# Patient Record
Sex: Female | Born: 1975 | Race: White | Hispanic: No | Marital: Single | Smoking: Former smoker
Health system: Southern US, Community
[De-identification: ages and names within clinical notes are randomized; demographics above are authoritative.]

## PROBLEM LIST (undated history)

## (undated) DIAGNOSIS — F419 Anxiety disorder, unspecified: Secondary | ICD-10-CM

## (undated) HISTORY — PX: CHOLECYSTECTOMY: SHX55

---

## 2011-11-03 ENCOUNTER — Emergency Department (HOSPITAL_BASED_OUTPATIENT_CLINIC_OR_DEPARTMENT_OTHER)
Admission: EM | Admit: 2011-11-03 | Discharge: 2011-11-03 | Disposition: A | Discharge status: Home / Self Care | Payer: Medicaid Other | Attending: Emergency Medicine | Admitting: Emergency Medicine

## 2011-11-03 ENCOUNTER — Encounter (HOSPITAL_BASED_OUTPATIENT_CLINIC_OR_DEPARTMENT_OTHER): Payer: Self-pay | Admitting: *Deleted

## 2011-11-03 DIAGNOSIS — B9689 Other specified bacterial agents as the cause of diseases classified elsewhere: Secondary | ICD-10-CM

## 2011-11-03 DIAGNOSIS — N760 Acute vaginitis: Secondary | ICD-10-CM

## 2011-11-03 DIAGNOSIS — A499 Bacterial infection, unspecified: Secondary | ICD-10-CM | POA: Insufficient documentation

## 2011-11-03 DIAGNOSIS — F17200 Nicotine dependence, unspecified, uncomplicated: Secondary | ICD-10-CM | POA: Insufficient documentation

## 2011-11-03 HISTORY — DX: Anxiety disorder, unspecified: F41.9

## 2011-11-03 LAB — URINALYSIS, ROUTINE W REFLEX MICROSCOPIC
Glucose, UA: NEGATIVE mg/dL
Ketones, ur: 15 mg/dL — AB
Nitrite: NEGATIVE
Protein, ur: NEGATIVE mg/dL

## 2011-11-03 LAB — PREGNANCY, URINE: Preg Test, Ur: NEGATIVE

## 2011-11-03 LAB — URINE MICROSCOPIC-ADD ON

## 2011-11-03 LAB — WET PREP, GENITAL

## 2011-11-03 MED ORDER — METRONIDAZOLE 500 MG PO TABS: 500.0000 mg | ORAL_TABLET | Freq: Two times a day (BID) | ORAL | Status: AC

## 2011-11-03 NOTE — Discharge Instructions (Signed)
Bacterial Vaginosis Bacterial vaginosis (BV) is a vaginal infection where the normal balance of bacteria in the vagina is disrupted. The normal balance is then replaced by an overgrowth of certain bacteria. There are several different kinds of bacteria that can cause BV. BV is the most common vaginal infection in women of childbearing age. CAUSES   The cause of BV is not fully understood. BV develops when there is an increase or imbalance of harmful bacteria.   Some activities or behaviors can upset the normal balance of bacteria in the vagina and put women at increased risk including:   Having a new sex partner or multiple sex partners.   Douching.   Using an intrauterine device (IUD) for contraception.   It is not clear what role sexual activity plays in the development of BV. However, women that have never had sexual intercourse are rarely infected with BV.  Women do not get BV from toilet seats, bedding, swimming pools or from touching objects around them.  SYMPTOMS   Grey vaginal discharge.   A fish-like odor with discharge, especially after sexual intercourse.   Itching or burning of the vagina and vulva.   Burning or pain with urination.   Some women have no signs or symptoms at all.  DIAGNOSIS  Your caregiver must examine the vagina for signs of BV. Your caregiver will perform lab tests and look at the sample of vaginal fluid through a microscope. They will look for bacteria and abnormal cells (clue cells), a pH test higher than 4.5, and a positive amine test all associated with BV.  RISKS AND COMPLICATIONS   Pelvic inflammatory disease (PID).   Infections following gynecology surgery.   Developing HIV.   Developing herpes virus.  TREATMENT  Sometimes BV will clear up without treatment. However, all women with symptoms of BV should be treated to avoid complications, especially if gynecology surgery is planned. Female partners generally do not need to be treated. However,  BV may spread between female sex partners so treatment is helpful in preventing a recurrence of BV.   BV may be treated with antibiotics. The antibiotics come in either pill or vaginal cream forms. Either can be used with nonpregnant or pregnant women, but the recommended dosages differ. These antibiotics are not harmful to the baby.   BV can recur after treatment. If this happens, a second round of antibiotics will often be prescribed.   Treatment is important for pregnant women. If not treated, BV can cause a premature delivery, especially for a pregnant woman who had a premature birth in the past. All pregnant women who have symptoms of BV should be checked and treated.   For chronic reoccurrence of BV, treatment with a type of prescribed gel vaginally twice a week is helpful.  HOME CARE INSTRUCTIONS   Finish all medication as directed by your caregiver.   Do not have sex until treatment is completed.   Tell your sexual partner that you have a vaginal infection. They should see their caregiver and be treated if they have problems, such as a mild rash or itching.   Practice safe sex. Use condoms. Only have 1 sex partner.  PREVENTION  Basic prevention steps can help reduce the risk of upsetting the natural balance of bacteria in the vagina and developing BV:  Do not have sexual intercourse (be abstinent).   Do not douche.   Use all of the medicine prescribed for treatment of BV, even if the signs and symptoms go away.     Tell your sex partner if you have BV. That way, they can be treated, if needed, to prevent reoccurrence.  SEEK MEDICAL CARE IF:   Your symptoms are not improving after 3 days of treatment.   You have increased discharge, pain, or fever.  MAKE SURE YOU:   Understand these instructions.   Will watch your condition.   Will get help right away if you are not doing well or get worse.  FOR MORE INFORMATION  Division of STD Prevention (DSTDP), Centers for Disease  Control and Prevention: www.cdc.gov/std American Social Health Association (ASHA): www.ashastd.org  Document Released: 09/01/2005 Document Revised: 05/14/2011 Document Reviewed: 02/22/2009 [employer] Patient Information 2012 [employer]. 

## 2011-11-03 NOTE — ED Provider Notes (Signed)
Medical screening examination/treatment/procedure(s) were performed by non-physician practitioner and as supervising physician I was immediately available for consultation/collaboration.   Lora Glomski A. Apolo Cutshaw, MD 11/03/11 2347 

## 2011-11-03 NOTE — ED Notes (Signed)
Irregular menses. Implanted birth control. Here for abdominal pain, vomiting and diarrhea.

## 2011-11-03 NOTE — ED Provider Notes (Signed)
History     CSN: [id number]  Arrival date & time 11/03/11  2105   First MD Initiated Contact with Patient 11/03/11 2131      Chief Complaint  Patient presents with  . Abdominal Pain    (Consider location/radiation/quality/duration/timing/severity/associated sxs/prior treatment) HPI Comments: Pt states that her period has been abnormal for the last couple of months so she wanted to see if she had a miscarriage:pt states that the may have also been exposed to an std and she want to be checked for that   Patient is a 36 y.o. female presenting with abdominal pain. The history is provided by the patient. No language interpreter was used.  Abdominal Pain The primary symptoms of the illness include abdominal pain and vaginal discharge. The primary symptoms of the illness do not include nausea, vomiting, diarrhea or dysuria. The current episode started more than 2 days ago. The onset of the illness was gradual. The problem has not changed since onset. The vaginal discharge is not associated with dysuria.   The patient states that she believes she is currently not pregnant. The patient has not had a change in bowel habit.    Past Medical History  Diagnosis Date  . Anxiety     Past Surgical History  Procedure Date  . Cesarean section     No family history on file.  History  Substance Use Topics  . Smoking status: Current Everyday Smoker -- 0.5 packs/day  . Smokeless tobacco: Not on file  . Alcohol Use: Yes    OB History    Grav Para Term Preterm Abortions TAB SAB Ect Mult Living                  Review of Systems  Gastrointestinal: Positive for abdominal pain. Negative for nausea, vomiting and diarrhea.  Genitourinary: Positive for vaginal discharge. Negative for dysuria.  All other systems reviewed and are negative.    Allergies  Review of patient's allergies indicates no known allergies.  Home Medications  No current outpatient prescriptions on file.  BP 117/76   Pulse 80  Temp(Src) 98.3 F (36.8 C) (Oral)  Resp 22  SpO2 100%  LMP 08/16/2011  Physical Exam  Nursing note and vitals reviewed. Constitutional: She is oriented to person, place, and time. She appears well-developed and well-nourished.  HENT:  Head: Normocephalic and atraumatic.  Eyes: EOM are normal.  Neck: Neck supple.  Cardiovascular: Normal rate and regular rhythm.   Pulmonary/Chest: Effort normal and breath sounds normal.  Abdominal: Soft. Bowel sounds are normal. There is no tenderness.  Genitourinary:       White vaginal discharge:neg cmt  Musculoskeletal: Normal range of motion.  Neurological: She is alert and oriented to person, place, and time.  Skin: Skin is warm and dry.  Psychiatric: She has a normal mood and affect.    ED Course  Procedures (including critical care time)  Labs Reviewed  URINALYSIS, ROUTINE W REFLEX MICROSCOPIC - Abnormal; Notable for the following:    APPearance CLOUDY (*)    Ketones, ur 15 (*)    Leukocytes, UA TRACE (*)    All other components within normal limits  URINE MICROSCOPIC-ADD ON - Abnormal; Notable for the following:    Squamous Epithelial / LPF FEW (*)    Bacteria, UA MANY (*)    All other components within normal limits  PREGNANCY, URINE  GC/CHLAMYDIA PROBE AMP, GENITAL  WET PREP, GENITAL   No results found.   1. BV (bacterial vaginosis)  MDM  Possible early ZOX:WRUEA sent for culture:will treat for VW:UJWJ not consistent with pid        Teressa Lower, NP 11/03/11 2254

## 2011-11-04 NOTE — ED Notes (Signed)
Pt called requesting new rx instead of flagyl. RX for metrogel vaginal inserts, disp #5, no refills called to [facility] on [address] in [place] per pt request. Lonna Cobb gave verbal order for med change.

## 2011-11-05 LAB — GC/CHLAMYDIA PROBE AMP, GENITAL
Chlamydia, DNA Probe: NEGATIVE
GC Probe Amp, Genital: NEGATIVE

## 2011-11-05 LAB — URINE CULTURE
Colony Count: >=100000
Culture  Setup Time: 201302190713

## 2011-11-06 NOTE — ED Notes (Signed)
+   urine Chart sent to EDP office for review. 

## 2011-11-07 NOTE — ED Notes (Signed)
Rx for  Bactrim Ds 1 po po BID x 3 days needs to be called to pharmacy written by Felicie Morn. Attempt made to notify patient ,no answer.

## 2011-11-08 NOTE — ED Notes (Signed)
Attempted to call patient.  No answer.

## 2011-11-09 NOTE — ED Notes (Signed)
Attempted to call patient. No answer. Will send letter.  

## 2011-11-10 NOTE — ED Notes (Signed)
Rx called to  [facility]. [phone]

## 2012-02-08 ENCOUNTER — Encounter (HOSPITAL_BASED_OUTPATIENT_CLINIC_OR_DEPARTMENT_OTHER): Payer: Self-pay | Admitting: *Deleted

## 2012-02-08 ENCOUNTER — Emergency Department (HOSPITAL_BASED_OUTPATIENT_CLINIC_OR_DEPARTMENT_OTHER)
Admission: EM | Admit: 2012-02-08 | Discharge: 2012-02-09 | Disposition: A | Discharge status: Home / Self Care | Payer: Medicaid Other | Attending: Emergency Medicine | Admitting: Emergency Medicine

## 2012-02-08 ENCOUNTER — Emergency Department (HOSPITAL_BASED_OUTPATIENT_CLINIC_OR_DEPARTMENT_OTHER): Payer: Medicaid Other

## 2012-02-08 DIAGNOSIS — M79609 Pain in unspecified limb: Secondary | ICD-10-CM | POA: Insufficient documentation

## 2012-02-08 DIAGNOSIS — W208XXA Other cause of strike by thrown, projected or falling object, initial encounter: Secondary | ICD-10-CM | POA: Insufficient documentation

## 2012-02-08 DIAGNOSIS — L089 Local infection of the skin and subcutaneous tissue, unspecified: Secondary | ICD-10-CM | POA: Insufficient documentation

## 2012-02-08 DIAGNOSIS — S9030XA Contusion of unspecified foot, initial encounter: Secondary | ICD-10-CM | POA: Insufficient documentation

## 2012-02-08 DIAGNOSIS — F411 Generalized anxiety disorder: Secondary | ICD-10-CM | POA: Insufficient documentation

## 2012-02-08 MED ORDER — TRAMADOL HCL 50 MG PO TABS: 50.0000 mg | ORAL_TABLET | Freq: Four times a day (QID) | ORAL | Status: AC | PRN

## 2012-02-08 NOTE — Discharge Instructions (Signed)
Contusion (Bruise) of Foot Injury to the foot causes bruises (contusions). Contusions are caused by bleeding from small blood vessels that allow blood to leak out into the muscles, cord-like structures that attach muscle to bone (tendons), and/or other soft tissue.  CAUSES  Contusions of the foot are common. Bruises are frequently seen from:  Contact sports injuries.   The use of medications that thin the blood (anti-coagulants).   Aspirin and non-steroidal anti-inflammatory agents that decrease the clotting ability.   People with vitamin deficiencies.  SYMPTOMS  Signs of foot injury include pain and swelling. At first there may be discoloration from blood under the skin. This will appear blue to purple in color. As the bruise ages, the color turns yellow. Swelling may limit the movement of the toes.  Complications from foot injury may include:  Collections of blood leading to disability if calcium deposits form. These can later limit movement in the foot.   Infection of the foot if there are breaks in the skin.   Rupture of the tendons that may need surgical repair.  DIAGNOSIS  Diagnosing foot injuries can be made by observation. If problems continue, X-rays may be needed to make sure there are no broken bones (fractures). Continuing problems may require physical therapy.  HOME CARE INSTRUCTIONS   Apply ice to the injury for 15 to 20 minutes, 3 to 4 times per day. Put the ice in a plastic bag and place a towel between the bag of ice and your skin.   An elastic wrap (like an Ace bandage) may be used to keep swelling down.   Keep foot elevated to reduce swelling and discomfort.   Try to avoid standing or walking while the foot is painful. Do not resume use until instructed by your caregiver. Then begin use gradually. If pain develops, decrease use and continue the above measures. Gradually increase activities that do not cause discomfort until you slowly have normal use.   Only take  over-the-counter or prescription medicines for pain, discomfort, or fever as directed by your caregiver. Use only if your caregiver has not given medications that would interfere.   Begin daily rehabilitation exercises when supportive wrapping is no longer needed.   Use ice massage for 10 minutes before and after workouts. Fill a large styrofoam cup with water and freeze. Tear a small amount of foam from the top so ice protrudes. Massage ice firmly over the injured area in a circle about the size of a softball.   Always eat a well balanced diet.   Follow all instructions for follow up with your caregiver, any orthopedic referrals, physical therapy and rehabilitation. Any delay in obtaining necessary care could result in delayed healing, and temporary or permanent disability.  SEEK IMMEDIATE MEDICAL CARE IF:   Your pain and swelling increase, or pain is uncontrolled with medications.   You have loss of feeling in your foot, or your foot turns cold or blue.   An oral temperature above 102 F (38.9 C) develops, not controlled by medication.   Your foot becomes warm to touch, or you have more pain with movement of your toes.   Skin is broken and signs of infection occur (drainage, increasing pain, fever, headache, muscle aches, dizziness or a general ill feeling).   You develop new, unexplained symptoms, or an increase of the symptoms that brought you to your caregiver.  MAKE SURE YOU:   Understand these instructions.   Will watch your condition.   Will get help  right away if you are not doing well or get worse.  Document Released: 06/23/2006 Document Revised: 08/21/2011 Document Reviewed: 08/05/2011 [facility] Patient Information 2012 [place], [place].

## 2012-02-08 NOTE — ED Provider Notes (Signed)
Medical screening examination/treatment/procedure(s) were performed by non-physician practitioner and as supervising physician I was immediately available for consultation/collaboration.  Ethelda Chick, MD 02/08/12 [phone]

## 2012-02-08 NOTE — ED Provider Notes (Signed)
History     CSN: [id number]  Arrival date & time 02/08/12  2116   First MD Initiated Contact with Patient 02/08/12 2313     HPI Patient reports 2 weeks ago dropped a mayo jar which are on her left foot. Reports that she still has pain despite putting ice on foot. States tender to walk. Denies numbness, tingling, weakness, bruising, swelling. Reports no improvement with ibuprofen. Also reports a strange " pimple " on her left nipple. States it has only been there for a few days. Reports he tried to "pop it" but was unable to. Denies any pain, drainage, redness, swelling. Patient is a 36 y.o. female presenting with lower extremity pain. The history is provided by the patient.  Foot Pain This is a new problem. The current episode started 1 to 4 weeks ago. The problem occurs constantly. The problem has been unchanged. Pertinent negatives include no anorexia, chills, fever, nausea, numbness, vomiting or weakness. The symptoms are aggravated by walking and standing. She has tried ice and rest for the symptoms. The treatment provided no relief.    Past Medical History  Diagnosis Date  . Anxiety     Past Surgical History  Procedure Date  . Cesarean section     History reviewed. No pertinent family history.  History  Substance Use Topics  . Smoking status: Current Everyday Smoker -- 0.5 packs/day  . Smokeless tobacco: Not on file  . Alcohol Use: Yes    OB History    Grav Para Term Preterm Abortions TAB SAB Ect Mult Living                  Review of Systems  Constitutional: Negative for fever and chills.  Gastrointestinal: Negative for nausea, vomiting and anorexia.  Musculoskeletal:       Foot pain  Skin:       Pimple on nipple  Neurological: Negative for weakness and numbness.  All other systems reviewed and are negative.    Allergies  Review of patient's allergies indicates no known allergies.  Home Medications   Current Outpatient Rx  Name Route Sig Dispense Refill    . CLONAZEPAM 1 MG PO TABS Oral Take 1 mg by mouth 2 (two) times daily as needed.    . ETONOGESTREL 68 MG  IMPL Subcutaneous Inject 1 each into the skin once.    . IBUPROFEN 200 MG PO TABS Oral Take 800 mg by mouth every 6 (six) hours as needed. Patient used this medication for her foot pain.      BP 111/64  Pulse 76  Temp(Src) 98 F (36.7 C) (Oral)  Resp 20  Ht 5\' 4"  (1.626 m)  Wt 165 lb (74.844 kg)  BMI 28.32 kg/m2  SpO2 97%  LMP 02/06/2012  Physical Exam  Vitals reviewed. Constitutional: She is oriented to person, place, and time. She appears well-developed and well-nourished.  HENT:  Head: Normocephalic and atraumatic.  Eyes: Conjunctivae and EOM are normal. Pupils are equal, round, and reactive to light.  Neck: Normal range of motion. Neck supple. No spinous process tenderness and no muscular tenderness present. No edema, no erythema and normal range of motion present.  Abdominal: Soft. Bowel sounds are normal.          Musculoskeletal:       Left foot: She exhibits tenderness. She exhibits normal range of motion, no bony tenderness, no swelling, normal capillary refill, no crepitus and no deformity.       Feet:  Neurological:  She is alert and oriented to person, place, and time. She has normal strength. No sensory deficit. Gait normal.  Skin: Skin is warm and dry. Lesion (Small pustule on left nipple at the 2  o clock position) noted. No rash noted. No erythema. No pallor.    ED Course  Procedures  Dg Foot Complete Left  02/08/2012  *RADIOLOGY REPORT*  Clinical Data: Pain in the left foot after dropping a jar on the foot 2 weeks ago.  LEFT FOOT - COMPLETE 3+ VIEW  Comparison: None.  Findings: Mild dorsal soft tissue swelling.  Visualized bones appear intact. No evidence of acute fracture or subluxation.  No focal bone lesions.  Bone matrix and cortex appear intact.  No abnormal radiopaque densities in the soft tissues.  IMPRESSION: No acute bony abnormalities.  Original  Report Authenticated By: Marlon Pel, M.D.   MDM    .She placed the patient for comfort. Advised using warm compresses and warm water soaks her foot. Advised warm compresses on left breast. Discussed signs of infection and reasons to return for example if the pustule increases in size, becomes tender or erythematous. Patient voices understanding and is ready for discharge.      Thomasene Lot, PA-C 02/08/12 2349  Thomasene Lot, PA-C 02/08/12 2356

## 2012-02-08 NOTE — ED Notes (Signed)
Pt states she dropped a jar of mayo on her left foot 2 weeks ago. Also wants area on left nipple checked.

## 2012-10-15 ENCOUNTER — Emergency Department (HOSPITAL_BASED_OUTPATIENT_CLINIC_OR_DEPARTMENT_OTHER): Payer: Medicaid Other

## 2012-10-15 ENCOUNTER — Emergency Department (HOSPITAL_BASED_OUTPATIENT_CLINIC_OR_DEPARTMENT_OTHER)
Admission: EM | Admit: 2012-10-15 | Discharge: 2012-10-15 | Disposition: A | Discharge status: Home / Self Care | Payer: Medicaid Other | Attending: Emergency Medicine | Admitting: Emergency Medicine

## 2012-10-15 ENCOUNTER — Encounter (HOSPITAL_BASED_OUTPATIENT_CLINIC_OR_DEPARTMENT_OTHER): Payer: Self-pay | Admitting: Family Medicine

## 2012-10-15 DIAGNOSIS — W19XXXA Unspecified fall, initial encounter: Secondary | ICD-10-CM

## 2012-10-15 DIAGNOSIS — S0510XA Contusion of eyeball and orbital tissues, unspecified eye, initial encounter: Secondary | ICD-10-CM | POA: Insufficient documentation

## 2012-10-15 DIAGNOSIS — T148XXA Other injury of unspecified body region, initial encounter: Secondary | ICD-10-CM

## 2012-10-15 DIAGNOSIS — F411 Generalized anxiety disorder: Secondary | ICD-10-CM | POA: Insufficient documentation

## 2012-10-15 DIAGNOSIS — Y9289 Other specified places as the place of occurrence of the external cause: Secondary | ICD-10-CM | POA: Insufficient documentation

## 2012-10-15 DIAGNOSIS — S40019A Contusion of unspecified shoulder, initial encounter: Secondary | ICD-10-CM | POA: Insufficient documentation

## 2012-10-15 DIAGNOSIS — W11XXXA Fall on and from ladder, initial encounter: Secondary | ICD-10-CM | POA: Insufficient documentation

## 2012-10-15 DIAGNOSIS — Y9389 Activity, other specified: Secondary | ICD-10-CM | POA: Insufficient documentation

## 2012-10-15 DIAGNOSIS — F17200 Nicotine dependence, unspecified, uncomplicated: Secondary | ICD-10-CM | POA: Insufficient documentation

## 2012-10-15 DIAGNOSIS — M25519 Pain in unspecified shoulder: Secondary | ICD-10-CM

## 2012-10-15 MED ORDER — HYDROCODONE-ACETAMINOPHEN 5-325 MG PO TABS: 2.0000 | ORAL_TABLET | ORAL | Status: AC | PRN

## 2012-10-15 NOTE — ED Provider Notes (Signed)
History     CSN: [id number]  Arrival date & time 10/15/12  1158   First MD Initiated Contact with Patient 10/15/12 1203      Chief Complaint  Patient presents with  . Fall    (Consider location/radiation/quality/duration/timing/severity/associated sxs/prior treatment) HPI Comments: Pt states that she thinks she may have passed out but she is not sure:pt states that she is hurting all over but primarily in the right shoulder and around her right eye  Patient is a 37 y.o. female presenting with fall. The history is provided by the patient. No language interpreter was used.  Fall The accident occurred 6 to 12 hours ago. The fall occurred from a ladder. She fell from a height of 6 to 10 ft. She landed on carpet. There was no blood loss. The point of impact was the right shoulder. The pain is moderate. She was ambulatory at the scene. There was no entrapment after the fall. There was no drug use involved in the accident. There was no alcohol use involved in the accident.    Past Medical History  Diagnosis Date  . Anxiety     Past Surgical History  Procedure Date  . Cesarean section     No family history on file.  History  Substance Use Topics  . Smoking status: Current Every Day Smoker -- 0.5 packs/day  . Smokeless tobacco: Not on file  . Alcohol Use: Yes    OB History    Grav Para Term Preterm Abortions TAB SAB Ect Mult Living                  Review of Systems  Constitutional: Negative.   Respiratory: Negative.   Cardiovascular: Negative.     Allergies  Review of patient's allergies indicates no known allergies.  Home Medications   Current Outpatient Rx  Name  Route  Sig  Dispense  Refill  . CLONAZEPAM 1 MG PO TABS   Oral   Take 1 mg by mouth 2 (two) times daily as needed.         . ETONOGESTREL 68 MG [place] IMPL   Subcutaneous   Inject 1 each into the skin once.         . IBUPROFEN 200 MG PO TABS   Oral   Take 800 mg by mouth every 6 (six) hours as  needed. Patient used this medication for her foot pain.           BP 120/78  Pulse 108  Temp 97.9 F (36.6 C) (Oral)  Resp 18  SpO2 100%  LMP 10/09/2012  Physical Exam  Nursing note and vitals reviewed. Constitutional: She is oriented to person, place, and time. She appears well-developed and well-nourished.  HENT:       Pt has bruising around the right eye  Eyes: Conjunctivae normal and EOM are normal. Pupils are equal, round, and reactive to light.  Neck: Neck supple.  Cardiovascular: Normal rate and regular rhythm.   Pulmonary/Chest: Effort normal and breath sounds normal.  Abdominal: Soft. Bowel sounds are normal. There is no tenderness.  Musculoskeletal:       Right shoulder: She exhibits decreased range of motion, tenderness and bony tenderness. She exhibits no swelling and no deformity.       Cervical back: Normal.       Thoracic back: Normal.       Lumbar back: Normal.  Neurological: She is alert and oriented to person, place, and time. Coordination normal.  Skin:  Skin is warm and dry.  Psychiatric: She has a normal mood and affect.    ED Course  Procedures (including critical care time)  Labs Reviewed - No data to display Dg Shoulder Right  10/15/2012  *RADIOLOGY REPORT*  Clinical Data: Fall, right shoulder pain  RIGHT SHOULDER - 2+ VIEW  Comparison: None.  Findings: Three views of the right shoulder submitted.  No acute fracture or subluxation.  No radiopaque foreign body.  IMPRESSION: No acute fracture or subluxation.   Original Report Authenticated By: Natasha Mead, M.D.    Ct Head Wo Contrast  10/15/2012  *RADIOLOGY REPORT*  Clinical Data:  Status post fall.  Loss of consciousness and right periorbital hematoma.  CT HEAD WITHOUT CONTRAST CT MAXILLOFACIAL WITHOUT CONTRAST  Technique:  Multidetector CT imaging of the head and maxillofacial structures were performed using the standard protocol without intravenous contrast. Multiplanar CT image reconstructions of the  maxillofacial structures were also generated.  Comparison:   None.  CT HEAD  Findings: There is soft tissue swelling in the right frontal scalp. There is no evidence of acute intracranial hemorrhage, mass lesion, brain edema or extra-axial fluid collection.  The ventricles and subarachnoid spaces are appropriately sized for age.  There is no CT evidence of acute cortical infarction.  The visualized paranasal sinuses are clear. The calvarium is intact.  IMPRESSION: No acute intracranial or calvarial findings.  Right frontal scalp soft tissue hematoma.  CT MAXILLOFACIAL  Findings:   There is mild periorbital and preseptal orbital soft tissue swelling on the right.  No postseptal fluid collection or globe injury is identified.  The optic nerves and extraocular muscles appear normal.  There is no evidence of acute facial fracture.  There are no sinus air-fluid levels.  The temporal mandibular joints are intact. Several teeth are absent.  IMPRESSION:  1.  Right periorbital and pre septal soft tissue swelling. 2.  No evidence of facial fracture.   Original Report Authenticated By: Carey Bullocks, M.D.    Ct Maxillofacial Wo Cm  10/15/2012  *RADIOLOGY REPORT*  Clinical Data:  Status post fall.  Loss of consciousness and right periorbital hematoma.  CT HEAD WITHOUT CONTRAST CT MAXILLOFACIAL WITHOUT CONTRAST  Technique:  Multidetector CT imaging of the head and maxillofacial structures were performed using the standard protocol without intravenous contrast. Multiplanar CT image reconstructions of the maxillofacial structures were also generated.  Comparison:   None.  CT HEAD  Findings: There is soft tissue swelling in the right frontal scalp. There is no evidence of acute intracranial hemorrhage, mass lesion, brain edema or extra-axial fluid collection.  The ventricles and subarachnoid spaces are appropriately sized for age.  There is no CT evidence of acute cortical infarction.  The visualized paranasal sinuses are  clear. The calvarium is intact.  IMPRESSION: No acute intracranial or calvarial findings.  Right frontal scalp soft tissue hematoma.  CT MAXILLOFACIAL  Findings:   There is mild periorbital and preseptal orbital soft tissue swelling on the right.  No postseptal fluid collection or globe injury is identified.  The optic nerves and extraocular muscles appear normal.  There is no evidence of acute facial fracture.  There are no sinus air-fluid levels.  The temporal mandibular joints are intact. Several teeth are absent.  IMPRESSION:  1.  Right periorbital and pre septal soft tissue swelling. 2.  No evidence of facial fracture.   Original Report Authenticated By: Carey Bullocks, M.D.      1. Fall   2. Contusion  3. Shoulder pain       MDM  Pt not having any neuro deficits:no acute findings on NF:AOZH treat symptomatically with vicodin for pain        Teressa Lower, NP 10/15/12 1623

## 2012-10-15 NOTE — ED Notes (Signed)
Pt sts she fell from a ladder approx 10 ft last night at 2am. Pt c/o pain all over. Reports loc unknown time. Pt has hematoma to right eye, bruising to left arm.

## 2012-10-17 NOTE — ED Provider Notes (Signed)
Medical screening examination/treatment/procedure(s) were performed by non-physician practitioner and as supervising physician I was immediately available for consultation/collaboration.   Loren Racer, MD 10/17/12 [phone]

## 2013-07-30 ENCOUNTER — Emergency Department (HOSPITAL_BASED_OUTPATIENT_CLINIC_OR_DEPARTMENT_OTHER)
Admission: EM | Admit: 2013-07-30 | Discharge: 2013-07-30 | Disposition: A | Discharge status: Home / Self Care | Payer: Medicaid Other | Attending: Emergency Medicine | Admitting: Emergency Medicine

## 2013-07-30 ENCOUNTER — Encounter (HOSPITAL_BASED_OUTPATIENT_CLINIC_OR_DEPARTMENT_OTHER): Payer: Self-pay | Admitting: Emergency Medicine

## 2013-07-30 DIAGNOSIS — Z3202 Encounter for pregnancy test, result negative: Secondary | ICD-10-CM | POA: Insufficient documentation

## 2013-07-30 DIAGNOSIS — N390 Urinary tract infection, site not specified: Secondary | ICD-10-CM | POA: Insufficient documentation

## 2013-07-30 DIAGNOSIS — F411 Generalized anxiety disorder: Secondary | ICD-10-CM | POA: Insufficient documentation

## 2013-07-30 DIAGNOSIS — Z79899 Other long term (current) drug therapy: Secondary | ICD-10-CM | POA: Insufficient documentation

## 2013-07-30 DIAGNOSIS — F17200 Nicotine dependence, unspecified, uncomplicated: Secondary | ICD-10-CM | POA: Insufficient documentation

## 2013-07-30 LAB — URINALYSIS, ROUTINE W REFLEX MICROSCOPIC
Bilirubin Urine: NEGATIVE
Ketones, ur: NEGATIVE mg/dL
Nitrite: NEGATIVE
Protein, ur: NEGATIVE mg/dL
Specific Gravity, Urine: 1.015 (ref 1.005–1.030)
Urobilinogen, UA: 1 mg/dL (ref 0.0–1.0)

## 2013-07-30 MED ORDER — SULFAMETHOXAZOLE-TMP DS 800-160 MG PO TABS: 1.0000 | ORAL_TABLET | Freq: Two times a day (BID) | ORAL | Status: AC

## 2013-07-30 MED ORDER — SULFAMETHOXAZOLE-TMP DS 800-160 MG PO TABS
1.0000 | ORAL_TABLET | Freq: Once | ORAL | Status: AC
  Administered 2013-07-30: 1 via ORAL
  Filled 2013-07-30: qty 1

## 2013-07-30 NOTE — ED Provider Notes (Signed)
Medical screening examination/treatment/procedure(s) were performed by non-physician practitioner and as supervising physician I was immediately available for consultation/collaboration.  EKG Interpretation   None         Shanna Cisco, MD 07/30/13 2038

## 2013-07-30 NOTE — ED Provider Notes (Signed)
CSN: [id number]     Arrival date & time 07/30/13  1209 History   First MD Initiated Contact with Patient 07/30/13 1228     Chief Complaint  Patient presents with  . Urinary Tract Infection   (Consider location/radiation/quality/duration/timing/severity/associated sxs/prior Treatment) Patient is a 37 y.o. female presenting with urinary tract infection. The history is provided by the patient. No language interpreter was used.  Urinary Tract Infection This is a new problem. The current episode started in the past 7 days. The problem occurs constantly. The problem has been gradually worsening. Pertinent negatives include no chills, fever, nausea or vomiting. Associated symptoms comments: She has a malodorous urine and urinary frequency. No fever, N, V or significant abdominal pain. She denies abnormal vaginal discharge. She has had UTI's in the past and feels this may be what is going on..    Past Medical History  Diagnosis Date  . Anxiety    Past Surgical History  Procedure Laterality Date  . Cesarean section     No family history on file. History  Substance Use Topics  . Smoking status: Current Every Day Smoker -- 0.50 packs/day  . Smokeless tobacco: Not on file  . Alcohol Use: Yes   OB History   Grav Para Term Preterm Abortions TAB SAB Ect Mult Living                 Review of Systems  Constitutional: Negative for fever and chills.  Gastrointestinal: Negative.  Negative for nausea and vomiting.  Genitourinary: Positive for urgency and frequency. Negative for flank pain and pelvic pain.  Musculoskeletal: Negative.   Skin: Negative.   Neurological: Negative.     Allergies  Review of patient's allergies indicates no known allergies.  Home Medications   Current Outpatient Rx  Name  Route  Sig  Dispense  Refill  . lisdexamfetamine (VYVANSE) 40 MG capsule   Oral   Take 40 mg by mouth every morning.         . clonazePAM (KLONOPIN) 1 MG tablet   Oral   Take 1 mg by  mouth 2 (two) times daily as needed.         . etonogestrel (IMPLANON) 68 MG IMPL implant   Subcutaneous   Inject 1 each into the skin once.         Marland Kitchen HYDROcodone-acetaminophen (NORCO/VICODIN) 5-325 MG per tablet   Oral   Take 2 tablets by mouth every 4 (four) hours as needed for pain.   10 tablet   0   . ibuprofen (ADVIL,MOTRIN) 200 MG tablet   Oral   Take 800 mg by mouth every 6 (six) hours as needed. Patient used this medication for her foot pain.          BP 113/75  Pulse 89  Temp(Src) 98.2 F (36.8 C) (Oral)  Resp 18  SpO2 100%  LMP 07/10/2013 Physical Exam  Constitutional: She is oriented to person, place, and time. She appears well-developed and well-nourished.  Neck: Normal range of motion.  Pulmonary/Chest: Effort normal.  Abdominal: There is no tenderness.  Neurological: She is alert and oriented to person, place, and time.  Skin: Skin is warm and dry.    ED Course  Procedures (including critical care time) Labs Review Labs Reviewed  URINALYSIS, ROUTINE W REFLEX MICROSCOPIC - Abnormal; Notable for the following:    APPearance CLOUDY (*)    Leukocytes, UA TRACE (*)    All other components within normal limits  URINE MICROSCOPIC-ADD ON -  Abnormal; Notable for the following:    Squamous Epithelial / LPF FEW (*)    Bacteria, UA FEW (*)    All other components within normal limits  URINE CULTURE  PREGNANCY, URINE   Imaging Review No results found.  EKG Interpretation   None       MDM  No diagnosis found. 1. UTI  Uncomplicated UTI, culture pending. Septra started. VSS.    Arnoldo Hooker, PA-C 07/30/13 1300

## 2013-07-30 NOTE — ED Notes (Signed)
Patient states that her urine has a "strong smell". She denies burning or frequency. Feels like she may have a UTI.

## 2013-08-01 LAB — URINE CULTURE: Culture: >=100000

## 2014-08-29 ENCOUNTER — Encounter (HOSPITAL_BASED_OUTPATIENT_CLINIC_OR_DEPARTMENT_OTHER): Payer: Self-pay | Admitting: *Deleted

## 2014-08-29 ENCOUNTER — Emergency Department (HOSPITAL_BASED_OUTPATIENT_CLINIC_OR_DEPARTMENT_OTHER)
Admission: EM | Admit: 2014-08-29 | Discharge: 2014-08-29 | Disposition: A | Discharge status: Home / Self Care | Payer: Medicaid Other | Attending: Emergency Medicine | Admitting: Emergency Medicine

## 2014-08-29 ENCOUNTER — Emergency Department (HOSPITAL_BASED_OUTPATIENT_CLINIC_OR_DEPARTMENT_OTHER): Payer: Medicaid Other

## 2014-08-29 DIAGNOSIS — F419 Anxiety disorder, unspecified: Secondary | ICD-10-CM | POA: Diagnosis not present

## 2014-08-29 DIAGNOSIS — Z7952 Long term (current) use of systemic steroids: Secondary | ICD-10-CM | POA: Insufficient documentation

## 2014-08-29 DIAGNOSIS — Z79899 Other long term (current) drug therapy: Secondary | ICD-10-CM | POA: Diagnosis not present

## 2014-08-29 DIAGNOSIS — R05 Cough: Secondary | ICD-10-CM

## 2014-08-29 DIAGNOSIS — J069 Acute upper respiratory infection, unspecified: Secondary | ICD-10-CM | POA: Diagnosis present

## 2014-08-29 DIAGNOSIS — Z720 Tobacco use: Secondary | ICD-10-CM | POA: Diagnosis not present

## 2014-08-29 DIAGNOSIS — B9789 Other viral agents as the cause of diseases classified elsewhere: Secondary | ICD-10-CM

## 2014-08-29 DIAGNOSIS — R059 Cough, unspecified: Secondary | ICD-10-CM

## 2014-08-29 MED ORDER — ALBUTEROL SULFATE HFA 108 (90 BASE) MCG/ACT IN AERS: 1.0000 | INHALATION_SPRAY | Freq: Four times a day (QID) | RESPIRATORY_TRACT | Status: AC | PRN

## 2014-08-29 MED ORDER — BENZONATATE 100 MG PO CAPS: 100.0000 mg | ORAL_CAPSULE | Freq: Three times a day (TID) | ORAL | Status: AC

## 2014-08-29 MED ORDER — CETIRIZINE HCL 10 MG PO CAPS: 10.0000 mg | ORAL_CAPSULE | Freq: Every evening | ORAL | Status: AC | PRN

## 2014-08-29 MED ORDER — PREDNISONE 10 MG PO TABS
40.0000 mg | ORAL_TABLET | Freq: Every day | ORAL | Status: AC
Start: 2014-08-29 — End: ?

## 2014-08-29 NOTE — ED Notes (Signed)
Pt c/o URI symptoms x 1 week 

## 2014-08-29 NOTE — ED Notes (Signed)
Cough x 2 days- family member has same sx- took OTC meds with some relief

## 2014-08-29 NOTE — ED Provider Notes (Signed)
CSN: [id number]     Arrival date & time 08/29/14  1748 History   First MD Initiated Contact with Patient 08/29/14 1840     Chief Complaint  Patient presents with  . URI   HPI  Patient is a 38 year old female who presents emergency room for evaluation of cough congestion and shortness of breath 2 days. Patient states that over the last 2 days she has had a gradual increase of cough, nasal congestion, chest congestion, runny nose, and fatigue. Patient states that her cough is productive of sputum that looks like this mucous that she blows out of her nose. She states that her cough is worse at nighttime. She is also tried some Tylenol. She has not had any relief. She feels that she developed more short of breath than normal. She has been using her inhaler with good relief. Patient states that her son is also sick. He lives in the same house. She denies any fevers. Patient is a smoker and smokes at least a half pack per day. She has been smoking for 20 years.  Past Medical History  Diagnosis Date  . Anxiety    Past Surgical History  Procedure Laterality Date  . Cesarean section     History reviewed. No pertinent family history. History  Substance Use Topics  . Smoking status: Current Every Day Smoker -- 0.50 packs/day  . Smokeless tobacco: Not on file  . Alcohol Use: Yes   OB History    No data available     Review of Systems  Constitutional: Positive for chills and fatigue. Negative for fever.  HENT: Positive for congestion, postnasal drip and rhinorrhea. Negative for ear pain, facial swelling, hearing loss, nosebleeds, sinus pressure, sneezing, sore throat, trouble swallowing and voice change.   Respiratory: Positive for cough, shortness of breath and wheezing. Negative for chest tightness.   Cardiovascular: Negative for chest pain, palpitations and leg swelling.  Gastrointestinal: Negative for nausea and vomiting.  All other systems reviewed and are negative.     Allergies   Review of patient's allergies indicates no known allergies.  Home Medications   Prior to Admission medications   Medication Sig Start Date End Date Taking? Authorizing Provider  albuterol (PROVENTIL HFA;VENTOLIN HFA) 108 (90 BASE) MCG/ACT inhaler Inhale 1-2 puffs into the lungs every 6 (six) hours as needed for wheezing or shortness of breath. 08/29/14   Dallen Bunte A Forcucci, PA-C  benzonatate (TESSALON) 100 MG capsule Take 1 capsule (100 mg total) by mouth every 8 (eight) hours. 08/29/14   Tyden Kann A Forcucci, PA-C  Cetirizine HCl 10 MG CAPS Take 1 capsule (10 mg total) by mouth at bedtime as needed (congestion). 08/29/14   Kennia Vanvorst A Forcucci, PA-C  clonazePAM (KLONOPIN) 1 MG tablet Take 1 mg by mouth 2 (two) times daily as needed.    Historical Provider, MD  etonogestrel (IMPLANON) 68 MG IMPL implant Inject 1 each into the skin once.    Historical Provider, MD  HYDROcodone-acetaminophen (NORCO/VICODIN) 5-325 MG per tablet Take 2 tablets by mouth every 4 (four) hours as needed for pain. 10/15/12   Teressa LowerVrinda Pickering, NP  ibuprofen (ADVIL,MOTRIN) 200 MG tablet Take 800 mg by mouth every 6 (six) hours as needed. Patient used this medication for her foot pain.    Historical Provider, MD  lisdexamfetamine (VYVANSE) 40 MG capsule Take 40 mg by mouth every morning.    Historical Provider, MD  predniSONE (DELTASONE) 10 MG tablet Take 4 tablets (40 mg total) by mouth daily. 08/29/14  Rielly Corlett A Forcucci, PA-C  sulfamethoxazole-trimethoprim (BACTRIM DS) 800-160 MG per tablet Take 1 tablet by mouth 2 (two) times daily. 07/30/13   Shari A Upstill, PA-C   BP 117/72 mmHg  Pulse 102  Temp(Src) 98.4 F (36.9 C) (Oral)  Resp 16  Ht 5\' 4"  (1.626 m)  Wt 147 lb (66.679 kg)  BMI 25.22 kg/m2  SpO2 100% Physical Exam  Constitutional: She is oriented to person, place, and time. She appears well-developed and well-nourished. No distress.  HENT:  Head: Normocephalic and atraumatic.  Nose: Mucosal edema  present.  Mouth/Throat: Oropharynx is clear and moist. No oropharyngeal exudate.  Eyes: Conjunctivae and EOM are normal. Pupils are equal, round, and reactive to light. No scleral icterus.  Neck: Normal range of motion. Neck supple. No JVD present. No thyromegaly present.  Cardiovascular: Normal rate, regular rhythm, normal heart sounds and intact distal pulses.  Exam reveals no gallop and no friction rub.   No murmur heard. Pulmonary/Chest: Effort normal and breath sounds normal. No respiratory distress. She has no wheezes. She has no rales. She exhibits no tenderness.  Musculoskeletal: Normal range of motion.  Lymphadenopathy:    She has no cervical adenopathy.  Neurological: She is alert and oriented to person, place, and time.  Skin: Skin is warm and dry. She is not diaphoretic.  Psychiatric: She has a normal mood and affect. Her behavior is normal. Judgment and thought content normal.  Nursing note and vitals reviewed.   ED Course  Procedures (including critical care time) Labs Review Labs Reviewed - No data to display  Imaging Review Dg Chest 2 View  08/29/2014   CLINICAL DATA:  Cough.  EXAM: CHEST  2 VIEW  COMPARISON:  None.  FINDINGS: The heart size and mediastinal contours are within normal limits. Both lungs are clear. No pneumothorax or pleural effusion is noted. The visualized skeletal structures are unremarkable.  IMPRESSION: No acute cardiopulmonary abnormality seen.   Electronically Signed   By: Roque LiasJames  Green M.D.   On: 08/29/2014 20:16     EKG Interpretation None      MDM   Final diagnoses:  Cough  Viral URI with cough   Patient is a 38 year old female who presents emergency room for evaluation of cough, congestion, and fatigue. Physical exam reveals mucosal edema in the nose, and clear lung sounds. Vital signs are stable. Patient is mildly tachycardic, but can be orally hydrated. Patient is low risk for PE. Chest x-ray is negative. Suspect that this is likely a  viral upper respiratory infection. Given reports of wheezing suspect the patient will likely need a short course of prednisone for spasm. We'll also discharge home with Zyrtec, Tessalon Perles, and have suggested using nasal saline as needed for congestion. Patient to return to the emergency room for intractable fevers, worsening shortness of breath, or any other concerning symptoms. Patient Is stable for discharge at this time.     Eben Burowourtney A Forcucci, PA-C 08/29/14 2035  Toy CookeyMegan Docherty, MD 08/31/14 1302

## 2014-08-29 NOTE — Discharge Instructions (Signed)
Upper Respiratory Infection, Adult °An upper respiratory infection (URI) is also sometimes known as the common cold. The upper respiratory tract includes the nose, sinuses, throat, trachea, and bronchi. Bronchi are the airways leading to the lungs. Most people improve within 1 week, but symptoms can last up to 2 weeks. A residual cough may last even longer.  °CAUSES °Many different viruses can infect the tissues lining the upper respiratory tract. The tissues become irritated and inflamed and often become very moist. Mucus production is also common. A cold is contagious. You can easily spread the virus to others by oral contact. This includes kissing, sharing a glass, coughing, or sneezing. Touching your mouth or nose and then touching a surface, which is then touched by another person, can also spread the virus. °SYMPTOMS  °Symptoms typically develop 1 to 3 days after you come in contact with a cold virus. Symptoms vary from person to person. They may include: °· Runny nose. °· Sneezing. °· Nasal congestion. °· Sinus irritation. °· Sore throat. °· Loss of voice (laryngitis). °· Cough. °· Fatigue. °· Muscle aches. °· Loss of appetite. °· Headache. °· Low-grade fever. °DIAGNOSIS  °You might diagnose your own cold based on familiar symptoms, since most people get a cold 2 to 3 times a year. Your caregiver can confirm this based on your exam. Most importantly, your caregiver can check that your symptoms are not due to another disease such as strep throat, sinusitis, pneumonia, asthma, or epiglottitis. Blood tests, throat tests, and X-rays are not necessary to diagnose a common cold, but they may sometimes be helpful in excluding other more serious diseases. Your caregiver will decide if any further tests are required. °RISKS AND COMPLICATIONS  °You may be at risk for a more severe case of the common cold if you smoke cigarettes, have chronic heart disease (such as heart failure) or lung disease (such as asthma), or if  you have a weakened immune system. The very young and very old are also at risk for more serious infections. Bacterial sinusitis, middle ear infections, and bacterial pneumonia can complicate the common cold. The common cold can worsen asthma and chronic obstructive pulmonary disease (COPD). Sometimes, these complications can require emergency medical care and may be life-threatening. °PREVENTION  °The best way to protect against getting a cold is to practice good hygiene. Avoid oral or hand contact with people with cold symptoms. Wash your hands often if contact occurs. There is no clear evidence that vitamin C, vitamin E, echinacea, or exercise reduces the chance of developing a cold. However, it is always recommended to get plenty of rest and practice good nutrition. °TREATMENT  °Treatment is directed at relieving symptoms. There is no cure. Antibiotics are not effective, because the infection is caused by a virus, not by bacteria. Treatment may include: °· Increased fluid intake. Sports drinks offer valuable electrolytes, sugars, and fluids. °· Breathing heated mist or steam (vaporizer or shower). °· Eating chicken soup or other clear broths, and maintaining good nutrition. °· Getting plenty of rest. °· Using gargles or lozenges for comfort. °· Controlling fevers with ibuprofen or acetaminophen as directed by your caregiver. °· Increasing usage of your inhaler if you have asthma. °Zinc gel and zinc lozenges, taken in the first 24 hours of the common cold, can shorten the duration and lessen the severity of symptoms. Pain medicines may help with fever, muscle aches, and throat pain. A variety of non-prescription medicines are available to treat congestion and runny nose. Your caregiver   can make recommendations and may suggest nasal or lung inhalers for other symptoms.  °HOME CARE INSTRUCTIONS  °· Only take over-the-counter or prescription medicines for pain, discomfort, or fever as directed by your  caregiver. °· Use a warm mist humidifier or inhale steam from a shower to increase air moisture. This may keep secretions moist and make it easier to breathe. °· Drink enough water and fluids to keep your urine clear or pale yellow. °· Rest as needed. °· Return to work when your temperature has returned to normal or as your caregiver advises. You may need to stay home longer to avoid infecting others. You can also use a face mask and careful hand washing to prevent spread of the virus. °SEEK MEDICAL CARE IF:  °· After the first few days, you feel you are getting worse rather than better. °· You need your caregiver's advice about medicines to control symptoms. °· You develop chills, worsening shortness of breath, or brown or red sputum. These may be signs of pneumonia. °· You develop yellow or brown nasal discharge or pain in the face, especially when you bend forward. These may be signs of sinusitis. °· You develop a fever, swollen neck glands, pain with swallowing, or white areas in the back of your throat. These may be signs of strep throat. °SEEK IMMEDIATE MEDICAL CARE IF:  °· You have a fever. °· You develop severe or persistent headache, ear pain, sinus pain, or chest pain. °· You develop wheezing, a prolonged cough, cough up blood, or have a change in your usual mucus (if you have chronic lung disease). °· You develop sore muscles or a stiff neck. °Document Released: 02/25/2001 Document Revised: 11/24/2011 Document Reviewed: 12/07/2013 °ExitCare® Patient Information ©2015 [employer]. This information is not intended to replace advice given to you by your health care provider. Make sure you discuss any questions you have with your health care provider. ° °Emergency Department Resource Guide °1) Find a Doctor and Pay Out of Pocket °Although you won't have to find out who is covered by your insurance plan, it is a good idea to ask around and get recommendations. You will then need to call the office and see if  the doctor you have chosen will accept you as a new patient and what types of options they offer for patients who are self-pay. Some doctors offer discounts or will set up payment plans for their patients who do not have insurance, but you will need to ask so you aren't surprised when you get to your appointment. ° °2) Contact Your Local Health Department °Not all health departments have doctors that can see patients for sick visits, but many do, so it is worth a call to see if yours does. If you don't know where your local health department is, you can check in your phone book. The CDC also has a tool to help you locate your state's health department, and many state websites also have listings of all of their local health departments. ° °3) Find a Walk-in Clinic °If your illness is not likely to be very severe or complicated, you may want to try a walk in clinic. These are popping up all over the country in pharmacies, drugstores, and shopping centers. They're usually staffed by nurse practitioners or physician assistants that have been trained to treat common illnesses and complaints. They're usually fairly quick and inexpensive. However, if you have serious medical issues or chronic medical problems, these are probably not your best option. ° °  No Primary Care Doctor: °- Call [facility] at  832-8000 - they can help you locate a primary care doctor that  accepts your insurance, provides certain services, etc. °- Physician Referral Service- 1-800-533-3463 ° °Chronic Pain Problems: °Organization         Address  Phone   Notes  °[facility]  (336) 297-2271 Patients need to be referred by their primary care doctor.  ° °Medication Assistance: °Organization         Address  Phone   Notes  °[place] [facility] [address] °[place], [place] [address] (336) 641-8030 --Must be a resident of [place] °-- Must have NO insurance coverage whatsoever (no  Medicaid/ Medicare, etc.) °-- The pt. MUST have a primary care doctor that directs their care regularly and follows them in the community °  °[facility]  (866) 331-1348   °[employer]  (888) 892-1162   ° °Agencies that provide inexpensive medical care: °Organization         Address  Phone   Notes  °[facility]  (336) 832-8035   °[facility]    (336) 832-7272   °[facility] [address] °[place], [place] [address] (336) 832-4777   °[facility] [address], °[place] (336) 271-4999   °[facility]    (336) 373-0678   °[facility]    (336) 272-1050   °[facility] ° [address], [place] Phone:  (336) 832-4444, Fax:  (336) 832-4440 Hours of Operation:  9 am - 6 pm, M-F.  Also accepts Medicaid/Medicare and self-pay.  °[facility] ° [address], [place] Phone: (336) 832-3150, Fax: (336) 832-3151. Hours of Operation:  8:30 am - 5:30 pm, M-F.  Also accepts Medicaid and self-pay.  °[facility] [place] [address], [place] Phone: (336) 878-6027   °[facility] [address], [place], [place] (336)723-1848, Ext. 123 Mondays & Thursdays: 7-9 AM.  First 15 patients are seen on a first come, first serve basis. °  ° °Medicaid-accepting [place] Providers: ° °Organization         Address  Phone   Notes  °[facility] [address], [place] (336) 641-2100 Also accepts self-pay patients.  °[facility] [address], [place] ° (336) 856-9996   °[facility] [address], [place] (336) 288-8857   °[facility] [address] [place] [address], [place] (336) 299-7000   °Veita Bland [address], [place]  ° (336) 373-1557 Only accepts [place] Access Medicaid patients after they have their name applied to their card.   ° °Self-Pay (no insurance) in [place]: ° °Organization         Address  Phone   Notes  °Sickle Cell Patients, [facility] [address], [place] (336) 832-1970   °[facility] [address], [place] (336) 832-4400   °[facility] [place] [facility] ° [address], [place] (336) 992-4800   °[facility]/Dr. Osei-Bonsu ° [address] [place] [address], [place] or [address], [place] (336) 841-8500 Phone number for both [place] and [place] locations is the same.  °[facility] [address],  (336) 299-0000   °  [facility] [address] [place] [address], [place] or [address] (336) 852-7530 °(336) 878-2260   °[facility] [address], [place] (336) 350-1642, phone; (336) 294-5005, fax Sees patients 1st and 3rd Saturday of every month.  Must not qualify for public or private insurance (i.e. Medicaid, Medicare, [place] Health Choice, Veterans' Benefits) • Household income should be no more than 200% of the poverty level •The clinic cannot treat you if you are pregnant or think you are pregnant • Sexually transmitted diseases are not treated at the clinic.  ° ° °Dental Care: °Organization         Address  Phone  Notes  °[facility] [address], [place] (336) 641-6152 Accepts children up to age 21 who are enrolled in Medicaid or [place] Health Choice; pregnant women with a Medicaid card; and children who have applied for Medicaid or [place] Health Choice, but were declined, whose parents can pay a reduced fee at time of service.  °[facility] [place]  [address], [place] (336) 641-7733 Accepts children up to age 21 who are enrolled in Medicaid or [place] Health Choice; pregnant women with a Medicaid card; and children who have applied for Medicaid or [place] Health Choice,  but were declined, whose parents can pay a reduced fee at time of service.  °[facility] ° [address], [place] (336) 641-4533 Patients are seen by appointment only. Walk-ins are not accepted. [facility] will see patients 18 years of age and older. °Monday - Tuesday (8am-5pm) °Most Wednesdays (8:30-5pm) °$30 per visit, cash only  °[facility] ° [address], [place] (336) 641-4533 Patients are seen by appointment only. Walk-ins are not accepted. [facility] will see patients 18 years of age and older. °One Wednesday Evening (Monthly: Volunteer Based).  $30 per visit, cash only  °[facility]  (919) 537-3737 for adults; Children under age 4, call Graduate Pediatric Dentistry at ([phone]) [phone]. Children aged 4-14, please call ([phone]) [phone] to request a pediatric application. ° Dental services are provided in all areas of dental care including fillings, crowns and bridges, complete and partial dentures, implants, gum treatment, root canals, and extractions. Preventive care is also provided. Treatment is provided to both adults and children. °Patients are selected via a lottery and there is often a waiting list. °  °[facility] [address], °[place] ° (336) 763-8833 [email or web address] °  °[facility] [address], [place], [place] (336)723-1848, Ext. 123 Second and Fourth Thursday of each month, opens at 6:30 AM; Clinic ends at 9 AM.  Patients are seen on a first-come first-served basis, and a limited number are seen during each clinic.  ° °[facility] ° [address], [place], [place] (336) 723-7904   Eligibility Requirements °You must have lived in [place], [place], or [place] counties for at least the last three months. °  You cannot be eligible for state or federal sponsored healthcare insurance, including Veterans Administration, Medicaid, or Medicare. °  You generally  cannot be eligible for healthcare insurance through your employer.  °  How to apply: °Eligibility screenings are held every Tuesday and Wednesday afternoon from 1:00 pm until 4:00 pm. You do not need an appointment for the interview!  °[facility] [address], [place], [place] 336-631-2330   °  [facility] [facility]  336-342-8273   °[facility]  336-703-3100   °[facility]  336-570-6415   ° °Behavioral Health Resources in the Community: °Intensive Outpatient Programs °Organization         Address  Phone  Notes  °[facility] [address], [place], [place] 336-878-6098   °[facility] [address], [place], [place] 336-832-9800   °[facility] [address], [place], [place] ° 336-882-2125   °[facility] [address],  °[place], [place] 1-800-853-5163 or 336-641-4981   °Substance Abuse Resources °Organization         Address  Phone  Notes  °[facility]  336-882-2125   °[facility]  336-784-9470   °[facility]  336-285-9073   °[facility]  336-845-3988   °Residential & Outpatient Substance Abuse Program  1-800-659-3381   °Psychological Services °Organization         Address  Phone  Notes  ° Health  336- 832-9600   °[facility]  336- 378-7881   °[facility] [address], [place] 1-800-853-5163 or 336-641-4981   ° °Mobile Crisis Teams °Organization         Address  Phone  Notes  °[facility], Mobile Crisis Care Unit  1-877-626-1772   °Assertive °[facility] ° [address] [place], [place] 336-834-9664   °Sharon DeEsch [address] °[place] 336-554-5454   ° °Self-Help/Support Groups °Organization         Address  Phone             Notes  °[facility] - variety of support groups  336- 373-1402 Call for more  information  °Narcotics Anonymous (NA), [facility] [address], °[place]  2 meetings at this location  ° °Residential Treatment Programs °Organization         Address  Phone  Notes  °[facility] [address],    °[place]  1-866-801-8205   °[facility] ° [address], [place], [place] 704-293-8524   °[facility] [address], [place] 336-845-3988 Admissions: 8am-3pm M-F  °[facility] [address]    °[place], [place] 336-841-1104   °[facility] [address], [place], [place] 336-379-7146   °[facility] [address]  °[place], [place] 336-285-9073   °[facility] - Intensive Outpatient [address], [place], [place] 336-852-3033   °[facility] [address]  °[place], [place] 1-877-615-2722 or 336-784-9470   °[facility] [address] , [place] 336-227-7417 Accepts Medicaid  °[facility] [address]  °[place] 1-800-659-3381 Substance Abuse/Addiction Treatment  ° °[place] Behavioral Health Resources °Organization         Address  Phone  Notes  °[facility]  (888) 581-9988   °Julie Brannon, PhD [address] [place], [place]   (336) 349-5553 or (336) 951-0000   °[facility]   [address] °[place], [place] (336) 349-4454   °[facility] [address], [place],  (336) 342-8316 Insurance/Medicaid/sponsorship through [facility]  °[facility] [address]                                      [place], [place] (336) 342-8316 Therapy/tele-psych/case  °[facility] [address]  ° [place], [place] (336) 349-2233    °Dr. Arfeen  (336) 349-4544   °[facility] [place]  [employer] [place] [facility] 1) [address],  °2) [address], [place] °3)  [address], [place] (336)  349-3220 °(336) 342-7768 ° °(336) 342-8140   °[place] [facility] (336) 342-1394 or (336) 342-3537 (After Hours)    ° ° °

## 2021-02-13 ENCOUNTER — Emergency Department (HOSPITAL_BASED_OUTPATIENT_CLINIC_OR_DEPARTMENT_OTHER)
Admission: EM | Admit: 2021-02-13 | Discharge: 2021-02-13 | Disposition: A | Discharge status: Home / Self Care | Payer: Medicaid Other | Attending: Emergency Medicine | Admitting: Emergency Medicine

## 2021-02-13 ENCOUNTER — Emergency Department (HOSPITAL_BASED_OUTPATIENT_CLINIC_OR_DEPARTMENT_OTHER): Payer: Medicaid Other

## 2021-02-13 ENCOUNTER — Encounter (HOSPITAL_BASED_OUTPATIENT_CLINIC_OR_DEPARTMENT_OTHER): Payer: Self-pay

## 2021-02-13 ENCOUNTER — Other Ambulatory Visit: Payer: Self-pay

## 2021-02-13 DIAGNOSIS — L739 Follicular disorder, unspecified: Secondary | ICD-10-CM

## 2021-02-13 DIAGNOSIS — H538 Other visual disturbances: Secondary | ICD-10-CM | POA: Insufficient documentation

## 2021-02-13 DIAGNOSIS — L662 Folliculitis decalvans: Secondary | ICD-10-CM | POA: Diagnosis not present

## 2021-02-13 DIAGNOSIS — R55 Syncope and collapse: Secondary | ICD-10-CM | POA: Diagnosis not present

## 2021-02-13 DIAGNOSIS — Z87891 Personal history of nicotine dependence: Secondary | ICD-10-CM | POA: Insufficient documentation

## 2021-02-13 DIAGNOSIS — R21 Rash and other nonspecific skin eruption: Secondary | ICD-10-CM | POA: Diagnosis present

## 2021-02-13 DIAGNOSIS — R413 Other amnesia: Secondary | ICD-10-CM

## 2021-02-13 DIAGNOSIS — R519 Headache, unspecified: Secondary | ICD-10-CM

## 2021-02-13 DIAGNOSIS — R42 Dizziness and giddiness: Secondary | ICD-10-CM | POA: Diagnosis not present

## 2021-02-13 LAB — CBC WITH DIFFERENTIAL/PLATELET
Abs Immature Granulocytes: 0.02 10*3/uL (ref 0.00–0.07)
Basophils Absolute: 0.1 10*3/uL (ref 0.0–0.1)
Basophils Relative: 1 %
Eosinophils Absolute: 0.1 10*3/uL (ref 0.0–0.5)
Eosinophils Relative: 2 %
HCT: 36.7 % (ref 36.0–46.0)
Hemoglobin: 12 g/dL (ref 12.0–15.0)
Immature Granulocytes: 0 %
Lymphocytes Relative: 33 %
Lymphs Abs: 2.5 10*3/uL (ref 0.7–4.0)
MCH: 28.4 pg (ref 26.0–34.0)
MCHC: 32.7 g/dL (ref 30.0–36.0)
MCV: 87 fL (ref 80.0–100.0)
Monocytes Absolute: 0.6 10*3/uL (ref 0.1–1.0)
Monocytes Relative: 8 %
Neutro Abs: 4.2 10*3/uL (ref 1.7–7.7)
Neutrophils Relative %: 56 %
Platelets: 317 10*3/uL (ref 150–400)
RBC: 4.22 MIL/uL (ref 3.87–5.11)
RDW: 13.4 % (ref 11.5–15.5)
WBC: 7.5 10*3/uL (ref 4.0–10.5)
nRBC: 0 % (ref 0.0–0.2)

## 2021-02-13 LAB — COMPREHENSIVE METABOLIC PANEL
ALT: 21 U/L (ref 0–44)
AST: 19 U/L (ref 15–41)
Albumin: 3.6 g/dL (ref 3.5–5.0)
Alkaline Phosphatase: 86 U/L (ref 38–126)
Anion gap: 6 (ref 5–15)
BUN: 9 mg/dL (ref 6–20)
CO2: 27 mmol/L (ref 22–32)
Calcium: 8.8 mg/dL — ABNORMAL LOW (ref 8.9–10.3)
Chloride: 105 mmol/L (ref 98–111)
Creatinine, Ser: 0.79 mg/dL (ref 0.44–1.00)
GFR, Estimated: >60 mL/min (ref >60)
Glucose, Bld: 108 mg/dL — ABNORMAL HIGH (ref 70–99)
Potassium: 4 mmol/L (ref 3.5–5.1)
Sodium: 138 mmol/L (ref 135–145)
Total Bilirubin: 0.2 mg/dL — ABNORMAL LOW (ref 0.3–1.2)
Total Protein: 6.6 g/dL (ref 6.5–8.1)

## 2021-02-13 NOTE — ED Triage Notes (Signed)
Arrives with bump on head that she noticed last night denies any tenderness to the area, denies any insect bites that she is aware of.

## 2021-02-13 NOTE — Discharge Instructions (Addendum)
Place warm compresses over the area of swelling on your head and monitor it. If it is persistent, discuss with PCP/may need dermatology evaluation. If it begins to become larger, increased redness, fevers, seek further car.e  Follow up with Neurologist and PCP regarding your other symptoms.

## 2021-02-13 NOTE — ED Notes (Signed)
ED Provider at bedside. 

## 2021-02-13 NOTE — ED Provider Notes (Signed)
[facility] EMERGENCY DEPARTMENT Provider Note   CSN: [id number] Arrival date & time: 02/13/21  0809     History Chief Complaint  Patient presents with  . Mass    Normajean Aguado is a 45 y.o. female.  HPI     Headaches for the last 3.5 weeks, left sided, coming and going, not associated with anything, not waking with headache Episode of dizziness yesterday, lightheaded like going to pass out with associated visual changes, for 3 minutes. No chest pain or palpitations. Denies numbness, weakness, difficulty talking or walking, or facial droop.   Feels sort of confused, startled. Walking around and forgetting what she is doing.  Forgetful.  Under a lot of stress. No nausea/vomiting  Has tiny area of swelling to scalp noticed last night, not painful, not sure how long its been there    Past Medical History:  Diagnosis Date  . Anxiety     There are no problems to display for this patient.   Past Surgical History:  Procedure Laterality Date  . CESAREAN SECTION    . CHOLECYSTECTOMY       OB History   No obstetric history on file.     No family history on file.  Social History   Tobacco Use  . Smoking status: Former Smoker    Packs/day: 0.50  . Smokeless tobacco: Never Used  Vaping Use  . Vaping Use: Every day  Substance Use Topics  . Alcohol use: Not Currently  . Drug use: Not Currently    Types: Marijuana    Home Medications Prior to Admission medications   Medication Sig Start Date End Date Taking? Authorizing Provider  albuterol (PROVENTIL HFA;VENTOLIN HFA) 108 (90 BASE) MCG/ACT inhaler Inhale 1-2 puffs into the lungs every 6 (six) hours as needed for wheezing or shortness of breath. 08/29/14   Shirleen Schirmer, PA-C  benzonatate (TESSALON) 100 MG capsule Take 1 capsule (100 mg total) by mouth every 8 (eight) hours. 08/29/14   Shirleen Schirmer, PA-C  Cetirizine HCl 10 MG CAPS Take 1 capsule (10 mg total) by mouth at bedtime as needed  (congestion). 08/29/14   Shirleen Schirmer, PA-C  clonazePAM (KLONOPIN) 1 MG tablet Take 1 mg by mouth 2 (two) times daily as needed.    [provider]  etonogestrel (IMPLANON) 68 MG IMPL implant Inject 1 each into the skin once.    [provider]  HYDROcodone-acetaminophen (NORCO/VICODIN) 5-325 MG per tablet Take 2 tablets by mouth every 4 (four) hours as needed for pain. 10/15/12   Teressa Lower, NP  ibuprofen (ADVIL,MOTRIN) 200 MG tablet Take 800 mg by mouth every 6 (six) hours as needed. Patient used this medication for her foot pain.    [provider]  lisdexamfetamine (VYVANSE) 40 MG capsule Take 40 mg by mouth every morning.    [provider]  predniSONE (DELTASONE) 10 MG tablet Take 4 tablets (40 mg total) by mouth daily. 08/29/14   Shirleen Schirmer, PA-C  sulfamethoxazole-trimethoprim (BACTRIM DS) 800-160 MG per tablet Take 1 tablet by mouth 2 (two) times daily. 07/30/13   Elpidio Anis, PA-C    Allergies    Patient has no known allergies.  Review of Systems   Review of Systems  Constitutional: Negative for fever.  HENT: Negative for sore throat.   Eyes: Negative for visual disturbance.  Respiratory: Negative for cough and shortness of breath.   Cardiovascular: Negative for chest pain.  Gastrointestinal: Negative for abdominal pain, blood in stool, diarrhea, nausea and vomiting.  Genitourinary: Negative for difficulty urinating.  Musculoskeletal: Negative for back pain and neck pain.  Skin: Negative for rash.  Neurological: Positive for dizziness, light-headedness and headaches. Negative for syncope, facial asymmetry, speech difficulty, weakness and numbness.  Psychiatric/Behavioral: Positive for decreased concentration.    Physical Exam Updated Vital Signs BP 106/70 (BP Location: Left Arm)   Pulse 78   Temp 98.3 F (36.8 C) (Oral)   Resp 12   Ht 5\' 5"  (1.651 m)   Wt 93 kg   LMP 02/06/2021   SpO2 100%   BMI 34.11 kg/m    Physical Exam Vitals and nursing note reviewed.  Constitutional:      General: She is not in acute distress.    Appearance: Normal appearance. She is well-developed. She is not ill-appearing or diaphoretic.  HENT:     Head: Normocephalic and atraumatic.     Comments: Tiny papule to left side of crown of head  Eyes:     General: No visual field deficit.    Extraocular Movements: Extraocular movements intact.     Conjunctiva/sclera: Conjunctivae normal.     Pupils: Pupils are equal, round, and reactive to light.  Cardiovascular:     Rate and Rhythm: Normal rate and regular rhythm.     Pulses: Normal pulses.     Heart sounds: Normal heart sounds. No murmur heard. No friction rub. No gallop.   Pulmonary:     Effort: Pulmonary effort is normal. No respiratory distress.     Breath sounds: Normal breath sounds. No wheezing or rales.  Abdominal:     General: There is no distension.     Palpations: Abdomen is soft.     Tenderness: There is no abdominal tenderness. There is no guarding.  Musculoskeletal:        General: No swelling or tenderness.     Cervical back: Normal range of motion.  Skin:    General: Skin is warm and dry.     Findings: No erythema or rash.  Neurological:     General: No focal deficit present.     Mental Status: She is alert and oriented to person, place, and time.     GCS: GCS eye subscore is 4. GCS verbal subscore is 5. GCS motor subscore is 6.     Cranial Nerves: No cranial nerve deficit, dysarthria or facial asymmetry.     Sensory: No sensory deficit.     Motor: No weakness or tremor.     Coordination: Coordination normal. Finger-Nose-Finger Test normal.     Gait: Gait normal.     ED Results / Procedures / Treatments   Labs (all labs ordered are listed, but only abnormal results are displayed) Labs Reviewed  COMPREHENSIVE METABOLIC PANEL - Abnormal; Notable for the following components:      Result Value   Glucose, Bld 108 (*)    Calcium 8.8 (*)     Total Bilirubin 0.2 (*)    All other components within normal limits  CBC WITH DIFFERENTIAL/PLATELET    EKG EKG Interpretation  Date/Time:  Wednesday February 13 2021 09:31:17 EDT Ventricular Rate:  65 PR Interval:  153 QRS Duration: 99 QT Interval:  419 QTC Calculation: 436 R Axis:   21 Text Interpretation: Sinus rhythm Abnormal R-wave progression, early transition No previous ECGs available Confirmed by [id number] (Alvira Monday) on 02/13/2021 10:54:47 AM   Radiology CT Head Wo Contrast  Result Date: 02/13/2021 CLINICAL DATA:  Headache, new or worsening, positional. Additional history provided: Confusion for several  weeks. EXAM: CT HEAD WITHOUT CONTRAST TECHNIQUE: Contiguous axial images were obtained from the base of the skull through the vertex without intravenous contrast. COMPARISON:  CT head 10/15/2012. FINDINGS: Brain: Cerebral volume is normal for age. Unchanged subcentimeter remote white matter insult within the right occipital lobe (series 2, image 19). There is no acute intracranial hemorrhage. No demarcated cortical infarct. No extra-axial fluid collection. No evidence of intracranial mass. No midline shift. Vascular: No hyperdense vessel. Skull: Normal. Negative for fracture or focal lesion. Sinuses/Orbits: Visualized orbits show no acute finding. No significant paranasal sinus disease at the imaged levels. IMPRESSION: No evidence of acute intracranial abnormality. Subcentimeter remote white matter insult within the right occipital lobe, unchanged as compared to the head CT of 10/15/2012. Electronically Signed   By: Jackey Loge DO   On: 02/13/2021 10:22    Procedures Procedures   Medications Ordered in ED Medications - No data to display  ED Course  I have reviewed the triage vital signs and the nursing notes.  Pertinent labs & imaging results that were available during my care of the patient were reviewed by me and considered in my medical decision making (see chart for  details).    MDM Rules/Calculators/A&P                          45yo female presents with concern for small lesion on head noted yesterday, episode of dizziness yesterday, persistent headaches and feeling of memory changes.   Tiny papule on head most consistent with inflamed follicle, recommend warm compresses, continued monitoring for worsening.   Regarding headaches, given persistent headaches with episode of dizziness yesterday CT head completed. CT does not show signs of significant acute abnormalities.  EKG does not show signs of arrhythmia. No sign of anemia or significant electrolyte abnormalities. History of episode of dizziness yesterday without other neurologic symptoms does not sound convincing for TIA.  Recommend continued outpatient follow up. Patient discharged in stable condition with understanding of reasons to return.    Final Clinical Impression(s) / ED Diagnoses Final diagnoses:  Folliculitis  Memory changes  New onset of headaches    Rx / DC Orders ED Discharge Orders    None       Alvira Monday, MD 02/14/21 [phone]

## 2022-12-25 IMAGING — CT CT HEAD W/O CM
3 series · 16 of 47 positions shown, 19 images · non-contrast
Comparison: CT head 10/15/2012.

CLINICAL DATA: Headache, new or worsening, positional. Additional
history provided: Confusion for several weeks.

EXAM:
CT HEAD WITHOUT CONTRAST
TECHNIQUE: Contiguous axial images were obtained from the base of the skull
through the vertex without intravenous contrast.

[Series 2: head wo · axial · 0.40mm/px · z∈[-165,-35]mm · 10 of 32 slices shown, 13 images]
[im 3/32  brain]
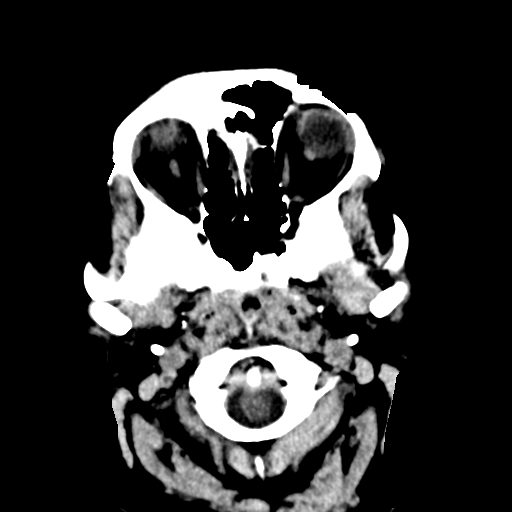
[im 3/32  bone]
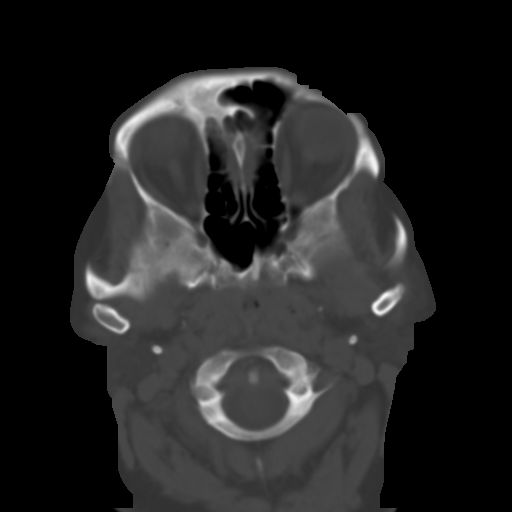
[im 6/32  brain]
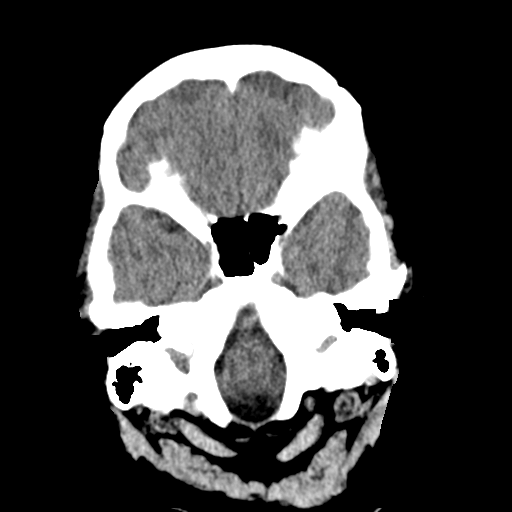
[im 9/32  brain]
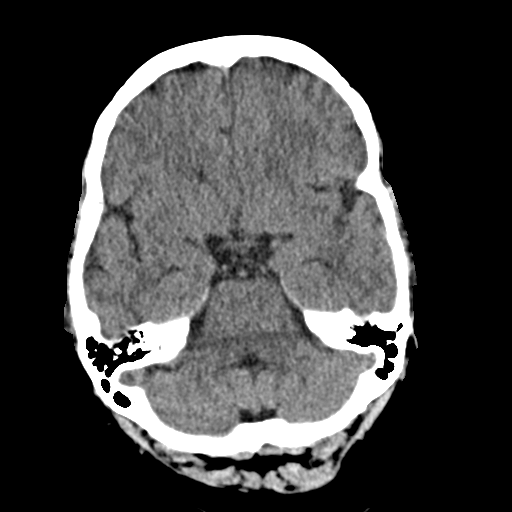
[im 11/32  brain]
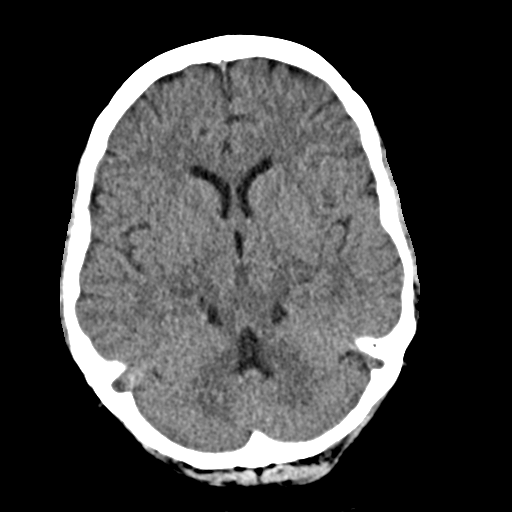
[im 14/32  brain]
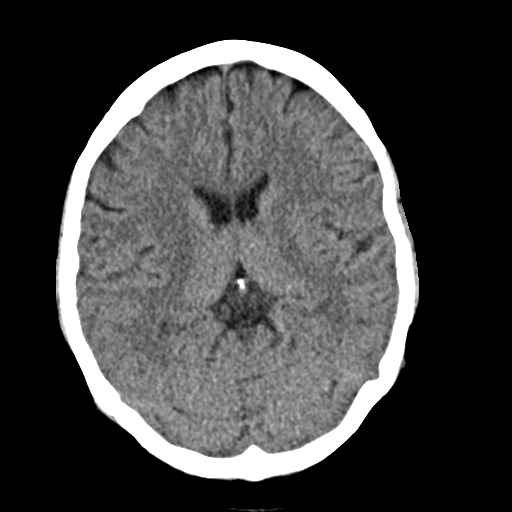
[im 14/32  bone]
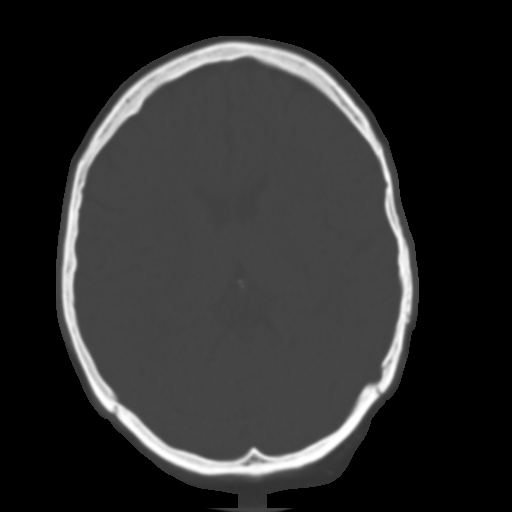
[im 18/32  brain]
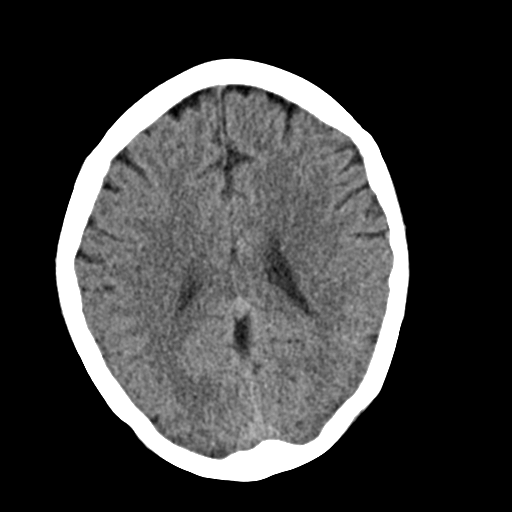
[im 21/32  brain]
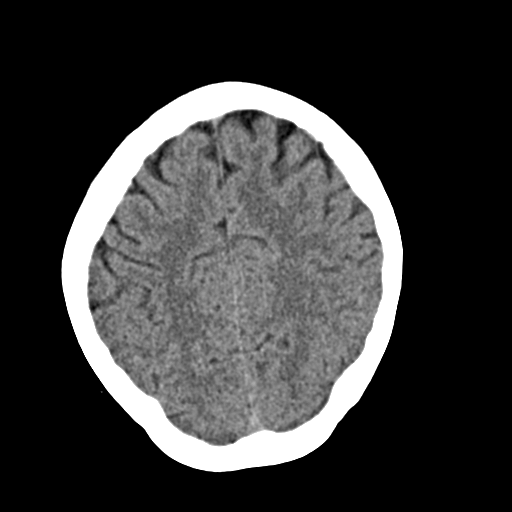
[im 24/32  brain]
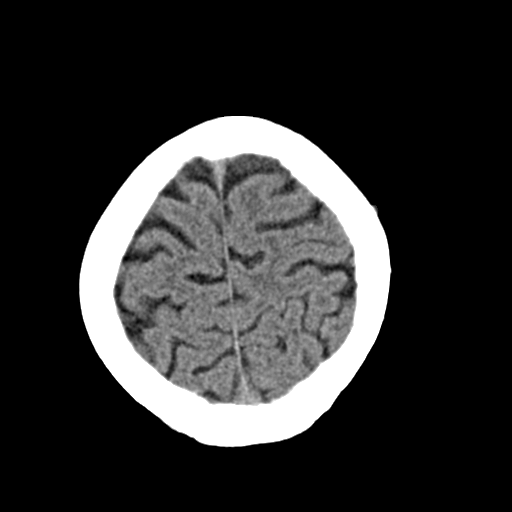
[im 26/32  brain]
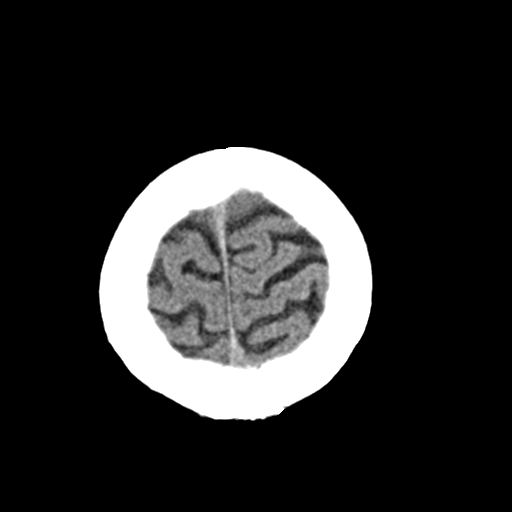
[im 26/32  bone]
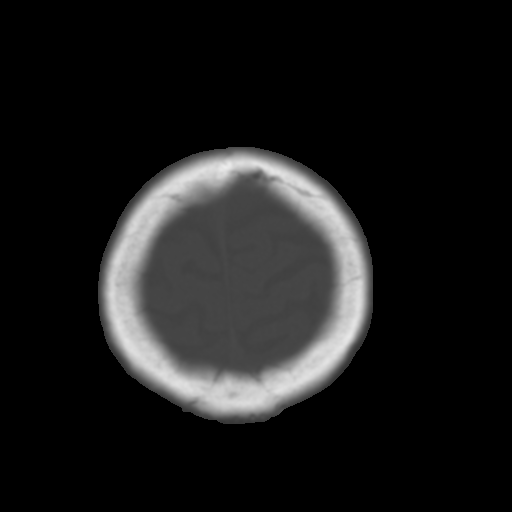
[im 29/32  brain]
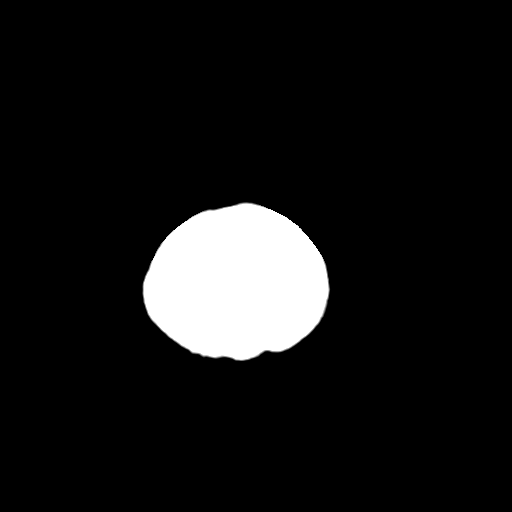

[Series 4: coronal soft · coronal · 0.34mm/px · 3 of 68 slices shown]
[im 23/68  brain]
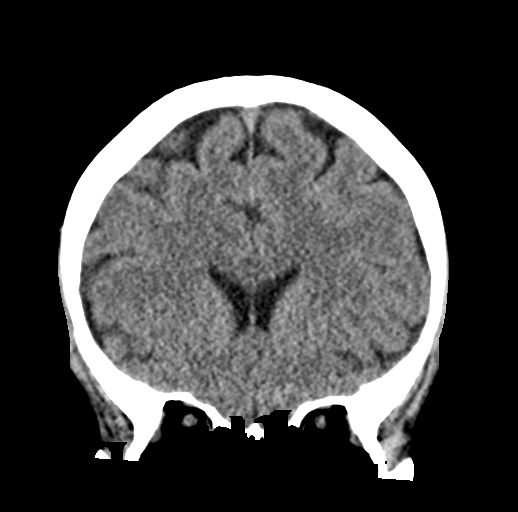
[im 30/68  brain]
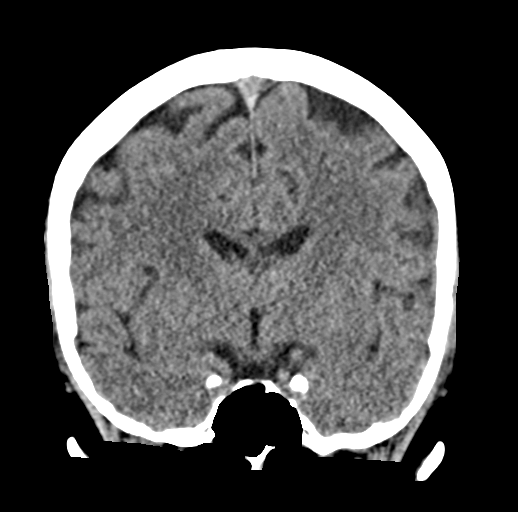
[im 38/68  brain]
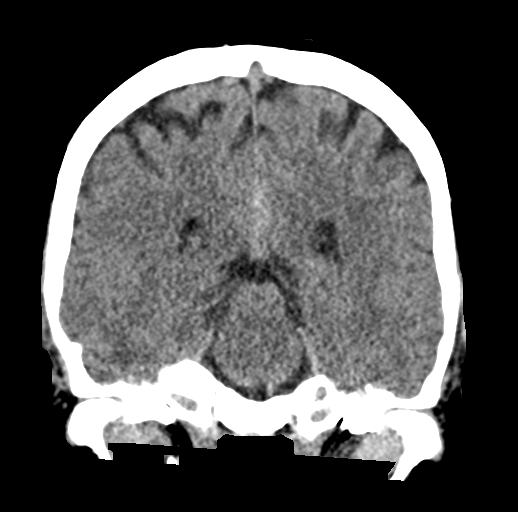

[Series 5: sag soft · sagittal · 0.33mm/px · 3 of 54 slices shown]
[im 18/54  brain]
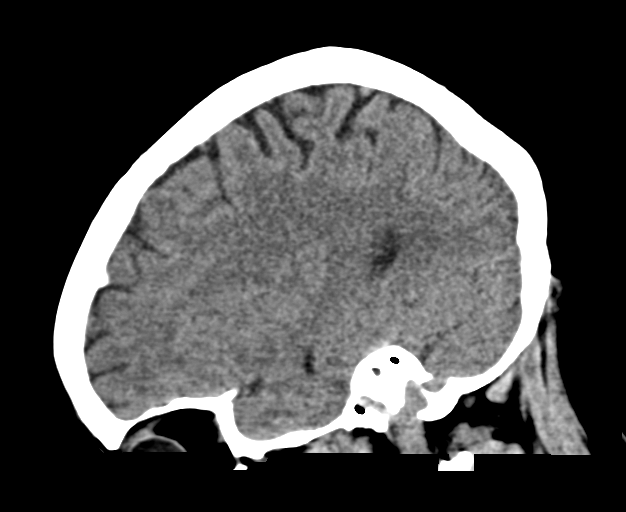
[im 27/54  brain]
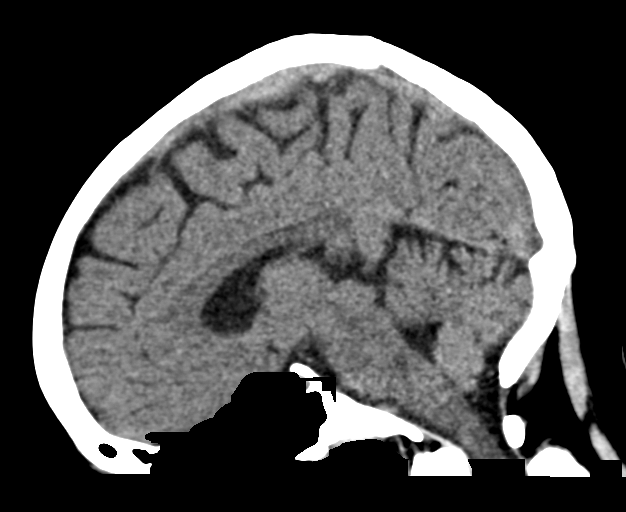
[im 36/54  brain]
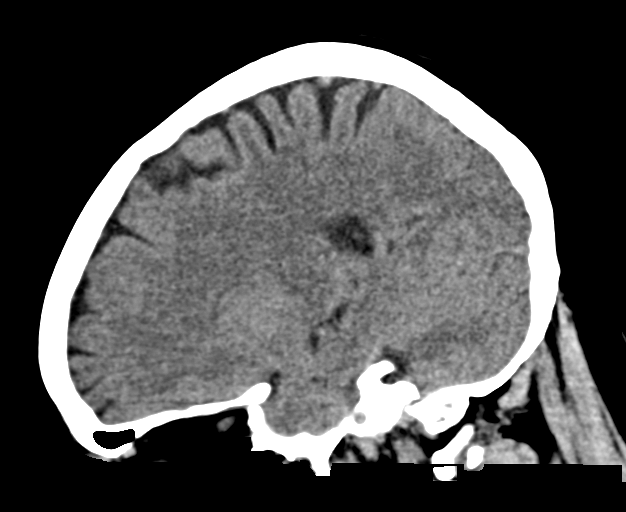

[16 of 47 positions shown; findings below may reference images not displayed]

FINDINGS: Brain:

Cerebral volume is normal for age.

Unchanged subcentimeter remote white matter insult within the right
occipital lobe (series 2, image 19).

There is no acute intracranial hemorrhage.

No demarcated cortical infarct.

No extra-axial fluid collection.

No evidence of intracranial mass.

No midline shift.

Vascular: No hyperdense vessel.

Skull: Normal. Negative for fracture or focal lesion.

Sinuses/Orbits: Visualized orbits show no acute finding. No
significant paranasal sinus disease at the imaged levels.
IMPRESSION: No evidence of acute intracranial abnormality.

Subcentimeter remote white matter insult within the right occipital
lobe, unchanged as compared to the head CT of 10/15/2012.

## 2024-07-25 ENCOUNTER — Other Ambulatory Visit: Payer: Self-pay

## 2024-07-25 ENCOUNTER — Encounter (HOSPITAL_BASED_OUTPATIENT_CLINIC_OR_DEPARTMENT_OTHER): Payer: Self-pay

## 2024-07-25 ENCOUNTER — Emergency Department (HOSPITAL_BASED_OUTPATIENT_CLINIC_OR_DEPARTMENT_OTHER)

## 2024-07-25 ENCOUNTER — Emergency Department (HOSPITAL_BASED_OUTPATIENT_CLINIC_OR_DEPARTMENT_OTHER)
Admission: EM | Admit: 2024-07-25 | Discharge: 2024-07-25 | Disposition: A | Discharge status: Home / Self Care | Attending: Emergency Medicine | Admitting: Emergency Medicine

## 2024-07-25 DIAGNOSIS — M79672 Pain in left foot: Secondary | ICD-10-CM | POA: Insufficient documentation

## 2024-07-25 DIAGNOSIS — Z87891 Personal history of nicotine dependence: Secondary | ICD-10-CM | POA: Diagnosis not present

## 2024-07-25 NOTE — ED Notes (Signed)
 Discharge instructions reviewed with patient. Patient verbalizes understanding, no further questions at this time. Medications and follow up information provided. No acute distress noted at time of departure.

## 2024-07-25 NOTE — ED Triage Notes (Signed)
 Reports L foot pain for 4 months. Had xr by orthopedic UC on 10/30, was given cam boot. Reports continued pain

## 2024-07-25 NOTE — Discharge Instructions (Addendum)
 Your x-ray does not show any obvious fracture of your calcaneus/heel, however it is possible that you do have a stress fracture that is too small to be visualized on x-ray imaging.  Continue ibuprofen/Tylenol  as needed for pain.  If you are planning to stand for long periods of time, please wear your cam boot.  If you are not planning to stand for long periods of time, you may use crutches for ambulation since you feel that your pain is worse while wearing the cam boot.  I have provided you with contact information for an orthopedic specialist, please schedule follow-up if your symptoms persist or if you would like a second opinion.  Keep your scheduled appointment with your orthopedic specialist for December 18.  Return to the emergency department if your symptoms worsen.

## 2024-07-25 NOTE — ED Provider Notes (Signed)
 [place] EMERGENCY DEPARTMENT AT [facility] Provider Note   CSN: [id number] Arrival date & time: 07/25/24  1527     Patient presents with: Foot Pain   Pamela Villanueva is a 48 y.o. female.   48 year old female presenting with left foot pain.  Patient was seen by an orthopedic specialist and had x-rays done on 10/30 for foot pain that developed about 4 weeks ago after she was running with her granddaughter outside.  She was told that her symptoms may be due to a possible calcaneal stress fracture, but was told that there was no obvious fracture or dislocation on her x-ray imaging.  She was discharged from Ortho with a cam boot and has a follow-up scheduled on 12/18 for this, however she notes that she has had worsening pain in her heel since that time.  She has been wearing the cam boot but notes that some days her foot feels worse after wearing it.  She describes her pain as like something is broken in my foot, exacerbated by use of cam boot.  She took Tylenol  today with minimal relief of her symptoms.     Foot Pain       Prior to Admission medications   Medication Sig Start Date End Date Taking? Authorizing Provider  albuterol  (PROVENTIL  HFA;VENTOLIN  HFA) 108 (90 BASE) MCG/ACT inhaler Inhale 1-2 puffs into the lungs every 6 (six) hours as needed for wheezing or shortness of breath. 08/29/14   Aniceto Pfeiffer, PA-C  benzonatate  (TESSALON ) 100 MG capsule Take 1 capsule (100 mg total) by mouth every 8 (eight) hours. 08/29/14   Aniceto Pfeiffer, PA-C  Cetirizine  HCl 10 MG CAPS Take 1 capsule (10 mg total) by mouth at bedtime as needed (congestion). 08/29/14   Aniceto Pfeiffer, PA-C  clonazePAM (KLONOPIN) 1 MG tablet Take 1 mg by mouth 2 (two) times daily as needed.    [provider]  etonogestrel (IMPLANON) 68 MG IMPL implant Inject 1 each into the skin once.    [provider]  HYDROcodone -acetaminophen  (NORCO/VICODIN) 5-325 MG per tablet Take 2 tablets  by mouth every 4 (four) hours as needed for pain. 10/15/12   Pickering, Vrinda, NP  ibuprofen (ADVIL,MOTRIN) 200 MG tablet Take 800 mg by mouth every 6 (six) hours as needed. Patient used this medication for her foot pain.    [provider]  lisdexamfetamine (VYVANSE) 40 MG capsule Take 40 mg by mouth every morning.    [provider]  predniSONE  (DELTASONE ) 10 MG tablet Take 4 tablets (40 mg total) by mouth daily. 08/29/14   Aniceto Pfeiffer, PA-C  sulfamethoxazole -trimethoprim  (BACTRIM  DS) 800-160 MG per tablet Take 1 tablet by mouth 2 (two) times daily. 07/30/13   Odell Balls, PA-C    Allergies: Patient has no known allergies.    Review of Systems  Updated Vital Signs  Vitals:   07/25/24 1547  BP: 114/76  Pulse: 82  Resp: 18  Temp: 98.3 F (36.8 C)  TempSrc: Oral  SpO2: 99%     Physical Exam Vitals and nursing note reviewed.  HENT:     Head: Normocephalic.  Eyes:     Extraocular Movements: Extraocular movements intact.  Cardiovascular:     Rate and Rhythm: Normal rate.  Pulmonary:     Effort: Pulmonary effort is normal.  Musculoskeletal:     Cervical back: Normal range of motion.     Comments: Moves all extremities spontaneously without difficulty Left foot/ankle: Diffuse tenderness to palpation of the calcaneus, no obvious  swelling/erythema/warmth or bony deformity, no evidence of puncture wound or bruising to the plantar aspect of the foot.  No tenderness to palpation of the medial/lateral malleolus or the dorsal aspect of the foot/remainder of the plantar aspect of the foot, intact plantarflexion/dorsiflexion. 2+ DP pulse.  Achilles tendon is intact and without tenderness to palpation, plantar fascia is without tenderness.  Skin:    General: Skin is warm and dry.  Neurological:     Mental Status: She is alert and oriented to person, place, and time.     (all labs ordered are listed, but only abnormal results are displayed) Labs Reviewed - No  data to display  EKG: None  Radiology: No results found.   Procedures   Medications Ordered in the ED - No data to display                                  Medical Decision Making This patient presents to the ED for concern of foot pain, this involves an extensive number of treatment options, and is a complaint that carries with it a high risk of complications and morbidity.  The differential diagnosis includes fracture, stress fracture, ligamentous/tendinous injury, plantar fasciitis   Co morbidities that complicate the patient evaluation  Recently diagnosed with possible calcaneal stress fracture   Additional history obtained:  Additional history obtained from record review External records from outside source obtained and reviewed including recent orthopedic note   Imaging Studies ordered:  I ordered imaging studies including L foot XR  I independently visualized and interpreted imaging which showed 1. No acute findings.  I agree with the radiologist interpretation   Cardiac Monitoring: / EKG:  The patient was maintained on a cardiac monitor.  I personally viewed and interpreted the cardiac monitored which showed an underlying rhythm of: NSR   Problem List / ED Course / Critical interventions / Medication management I have reviewed the patients home medicines and have made adjustments as needed   Social Determinants of Health:  Former tobacco use   Test / Admission - Considered:  Physical exam is notable as above, patient is tender to palpation of the left calcaneus, given suspicion for calcaneal stress fracture with worsening pain will reevaluate with repeated x-ray imaging, I am able to view the radiologic report from her recent x-ray at the end of October however I am unable to view these images for the purpose of comparison. X-ray imaging is notable as above.  I recommend the patient keep her scheduled appointment with orthopedic specialist but I will  provide her with the contact information for a separate orthopedic specialist in the event that she wishes to have a second opinion.  Given that her symptoms seem to be worsened by use of cam boot I will discharge her with crutches, I recommend that if she is to walk on her foot for any length of time she should wear the cam boot but otherwise the crutches should be sufficient.  I recommend that she continue Tylenol /ibuprofen for pain.  She voiced understanding and is in agreement this plan.    Amount and/or Complexity of Data Reviewed Radiology: ordered.        Final diagnoses:  Foot pain, left    ED Discharge Orders     None          Glendia Rocky SAILOR, [place] 07/25/24 1657    Darra Fonda MATSU, MD 07/25/24 [phone]
